# Patient Record
Sex: Male | Born: 1965 | Race: Black or African American | Hispanic: No | Marital: Single
Health system: Southern US, Community
[De-identification: ages and names within clinical notes are randomized; demographics above are authoritative.]

---

## 2005-01-22 ENCOUNTER — Emergency Department: Payer: Self-pay | Admitting: Emergency Medicine

## 2020-01-25 ENCOUNTER — Ambulatory Visit: Payer: Self-pay | Attending: Internal Medicine

## 2020-01-25 DIAGNOSIS — Z23 Encounter for immunization: Secondary | ICD-10-CM

## 2020-01-25 NOTE — Progress Notes (Signed)
   Covid-19 Vaccination [facility]  Name:  Jason Lozano    MRN: [id number] DOB: [birth year]  01/25/2020  Mr. [name] was observed post Covid-19 immunization for 15 minutes without incident. He was provided with Vaccine Information Sheet and instruction to access the V-Safe system.   Mr. [name] was instructed to call 911 with any severe reactions post vaccine: Marland Kitchen Difficulty breathing  . Swelling of face and throat  . A fast heartbeat  . A bad rash all over body  . Dizziness and weakness   Immunizations Administered    Name Date Dose VIS Date Route   Pfizer COVID-19 Vaccine 01/25/2020  9:56 AM 0.3 mL 08/11/2018 Intramuscular   Manufacturer: [employer], [employer]   Lot: [id number]   NDC: [address]-8

## 2020-02-15 ENCOUNTER — Ambulatory Visit: Payer: Self-pay

## 2021-01-01 ENCOUNTER — Emergency Department
Admission: EM | Admit: 2021-01-01 | Discharge: 2021-01-02 | Disposition: A | Discharge status: Home / Self Care | Payer: Self-pay | Attending: Emergency Medicine | Admitting: Emergency Medicine

## 2021-01-01 DIAGNOSIS — Z20822 Contact with and (suspected) exposure to covid-19: Secondary | ICD-10-CM | POA: Insufficient documentation

## 2021-01-01 DIAGNOSIS — S3991XA Unspecified injury of abdomen, initial encounter: Secondary | ICD-10-CM | POA: Insufficient documentation

## 2021-01-01 DIAGNOSIS — T07XXXA Unspecified multiple injuries, initial encounter: Secondary | ICD-10-CM

## 2021-01-01 DIAGNOSIS — T1490XA Injury, unspecified, initial encounter: Secondary | ICD-10-CM

## 2021-01-01 DIAGNOSIS — S41112A Laceration without foreign body of left upper arm, initial encounter: Secondary | ICD-10-CM | POA: Insufficient documentation

## 2021-01-01 DIAGNOSIS — F10129 Alcohol abuse with intoxication, unspecified: Secondary | ICD-10-CM | POA: Insufficient documentation

## 2021-01-01 DIAGNOSIS — S0101XA Laceration without foreign body of scalp, initial encounter: Secondary | ICD-10-CM | POA: Insufficient documentation

## 2021-01-01 DIAGNOSIS — S0181XA Laceration without foreign body of other part of head, initial encounter: Secondary | ICD-10-CM | POA: Insufficient documentation

## 2021-01-01 DIAGNOSIS — S01412A Laceration without foreign body of left cheek and temporomandibular area, initial encounter: Secondary | ICD-10-CM | POA: Insufficient documentation

## 2021-01-01 DIAGNOSIS — Y908 Blood alcohol level of 240 mg/100 ml or more: Secondary | ICD-10-CM | POA: Insufficient documentation

## 2021-01-01 DIAGNOSIS — S299XXA Unspecified injury of thorax, initial encounter: Secondary | ICD-10-CM | POA: Insufficient documentation

## 2021-01-01 DIAGNOSIS — Z79899 Other long term (current) drug therapy: Secondary | ICD-10-CM | POA: Insufficient documentation

## 2021-01-01 DIAGNOSIS — Z23 Encounter for immunization: Secondary | ICD-10-CM | POA: Insufficient documentation

## 2021-01-01 DIAGNOSIS — R791 Abnormal coagulation profile: Secondary | ICD-10-CM | POA: Insufficient documentation

## 2021-01-01 DIAGNOSIS — F1910 Other psychoactive substance abuse, uncomplicated: Secondary | ICD-10-CM

## 2021-01-01 DIAGNOSIS — F10929 Alcohol use, unspecified with intoxication, unspecified: Secondary | ICD-10-CM
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
## 2021-01-02 ENCOUNTER — Other Ambulatory Visit: Payer: Self-pay

## 2021-01-02 ENCOUNTER — Emergency Department: Payer: Self-pay

## 2021-01-02 LAB — MAGNESIUM: Magnesium: 1.8 mg/dL (ref 1.7–2.4)

## 2021-01-02 LAB — COMPREHENSIVE METABOLIC PANEL
ALT: 42 U/L (ref 0–44)
AST: 50 U/L — ABNORMAL HIGH (ref 15–41)
Albumin: 3.4 g/dL — ABNORMAL LOW (ref 3.5–5.0)
Alkaline Phosphatase: 56 U/L (ref 38–126)
Anion gap: 7 (ref 5–15)
BUN: 11 mg/dL (ref 6–20)
CO2: 22 mmol/L (ref 22–32)
Calcium: 7.7 mg/dL — ABNORMAL LOW (ref 8.9–10.3)
Chloride: 102 mmol/L (ref 98–111)
Creatinine, Ser: 0.95 mg/dL (ref 0.61–1.24)
GFR, Estimated: >60 mL/min (ref >60)
Glucose, Bld: 99 mg/dL (ref 70–99)
Potassium: 3.2 mmol/L — ABNORMAL LOW (ref 3.5–5.1)
Sodium: 131 mmol/L — ABNORMAL LOW (ref 135–145)
Total Bilirubin: 0.5 mg/dL (ref 0.3–1.2)
Total Protein: 6.9 g/dL (ref 6.5–8.1)

## 2021-01-02 LAB — URINALYSIS, COMPLETE (UACMP) WITH MICROSCOPIC
Bacteria, UA: NONE SEEN
Bilirubin Urine: NEGATIVE
Glucose, UA: NEGATIVE mg/dL
Ketones, ur: NEGATIVE mg/dL
Leukocytes,Ua: NEGATIVE
Nitrite: NEGATIVE
Protein, ur: 30 mg/dL — AB
Specific Gravity, Urine: 1.016 (ref 1.005–1.030)
Squamous Epithelial / HPF: NONE SEEN (ref 0–5)
pH: 6 (ref 5.0–8.0)

## 2021-01-02 LAB — URINE DRUG SCREEN, QUALITATIVE (ARMC ONLY)
Amphetamines, Ur Screen: NOT DETECTED
Barbiturates, Ur Screen: NOT DETECTED
Benzodiazepine, Ur Scrn: POSITIVE — AB
Cannabinoid 50 Ng, Ur ~~LOC~~: POSITIVE — AB
Cocaine Metabolite,Ur ~~LOC~~: POSITIVE — AB
MDMA (Ecstasy)Ur Screen: NOT DETECTED
Methadone Scn, Ur: NOT DETECTED
Opiate, Ur Screen: NOT DETECTED
Phencyclidine (PCP) Ur S: NOT DETECTED
Tricyclic, Ur Screen: POSITIVE — AB

## 2021-01-02 LAB — ETHANOL: Alcohol, Ethyl (B): 303 mg/dL (ref <10)

## 2021-01-02 LAB — RESP PANEL BY RT-PCR (FLU A&B, COVID) ARPGX2
Influenza A by PCR: NEGATIVE
Influenza B by PCR: NEGATIVE
SARS Coronavirus 2 by RT PCR: NEGATIVE

## 2021-01-02 LAB — CBC
HCT: 35.7 % — ABNORMAL LOW (ref 39.0–52.0)
Hemoglobin: 12.6 g/dL — ABNORMAL LOW (ref 13.0–17.0)
MCH: 32.4 pg (ref 26.0–34.0)
MCHC: 35.3 g/dL (ref 30.0–36.0)
MCV: 91.8 fL (ref 80.0–100.0)
Platelets: 218 10*3/uL (ref 150–400)
RBC: 3.89 MIL/uL — ABNORMAL LOW (ref 4.22–5.81)
RDW: 13.6 % (ref 11.5–15.5)
WBC: 4.1 10*3/uL (ref 4.0–10.5)
nRBC: 0 % (ref 0.0–0.2)

## 2021-01-02 LAB — SAMPLE TO BLOOD BANK

## 2021-01-02 LAB — TROPONIN I (HIGH SENSITIVITY)
Troponin I (High Sensitivity): 25 ng/L — ABNORMAL HIGH (ref <18)
Troponin I (High Sensitivity): 24 ng/L — ABNORMAL HIGH (ref <18)

## 2021-01-02 LAB — PROTIME-INR
INR: 1 (ref 0.8–1.2)
Prothrombin Time: 13.3 seconds (ref 11.4–15.2)

## 2021-01-02 LAB — LACTIC ACID, PLASMA: Lactic Acid, Venous: 2.2 mmol/L (ref 0.5–1.9)

## 2021-01-02 MED ORDER — THIAMINE HCL 100 MG/ML IJ SOLN
100.0000 mg | Freq: Once | INTRAMUSCULAR | Status: AC
  Administered 2021-01-02: 100 mg via INTRAVENOUS
  Filled 2021-01-02: qty 2

## 2021-01-02 MED ORDER — HALOPERIDOL LACTATE 5 MG/ML IJ SOLN: 2.0000 mg | Freq: Once | INTRAMUSCULAR | Status: DC

## 2021-01-02 MED ORDER — LACTATED RINGERS IV SOLN: INTRAVENOUS | Status: DC

## 2021-01-02 MED ORDER — LACTATED RINGERS IV BOLUS
1000.0000 mL | Freq: Once | INTRAVENOUS | Status: AC
  Administered 2021-01-02: 1000 mL via INTRAVENOUS

## 2021-01-02 MED ORDER — LORAZEPAM 2 MG/ML IJ SOLN
INTRAMUSCULAR | Status: AC
  Administered 2021-01-02: 2 mg via INTRAVENOUS
  Filled 2021-01-02: qty 1

## 2021-01-02 MED ORDER — LORAZEPAM 2 MG/ML IJ SOLN: 1.0000 mg | Freq: Once | INTRAMUSCULAR | Status: AC

## 2021-01-02 MED ORDER — CEPHALEXIN 500 MG PO CAPS: 500.0000 mg | ORAL_CAPSULE | Freq: Four times a day (QID) | ORAL | 0 refills | Status: DC

## 2021-01-02 MED ORDER — KETAMINE HCL 10 MG/ML IJ SOLN
40.0000 mg | Freq: Once | INTRAMUSCULAR | Status: AC
  Administered 2021-01-02: 40 mg via INTRAVENOUS

## 2021-01-02 MED ORDER — LORAZEPAM 2 MG/ML IJ SOLN
INTRAMUSCULAR | Status: AC
  Administered 2021-01-02: 1 mg via INTRAVENOUS
  Filled 2021-01-02: qty 1

## 2021-01-02 MED ORDER — IOHEXOL 300 MG/ML  SOLN
75.0000 mL | Freq: Once | INTRAMUSCULAR | Status: AC | PRN
  Administered 2021-01-02: 75 mL via INTRAVENOUS

## 2021-01-02 MED ORDER — HALOPERIDOL LACTATE 5 MG/ML IJ SOLN
INTRAMUSCULAR | Status: AC
  Administered 2021-01-02: 2 mg via INTRAVENOUS
  Filled 2021-01-02: qty 1

## 2021-01-02 MED ORDER — LORAZEPAM 2 MG/ML IJ SOLN: 1.0000 mg | Freq: Once | INTRAMUSCULAR | Status: DC

## 2021-01-02 MED ORDER — KETAMINE HCL 10 MG/ML IJ SOLN
80.0000 mg | Freq: Once | INTRAMUSCULAR | Status: AC
  Administered 2021-01-02: 80 mg via INTRAVENOUS

## 2021-01-02 MED ORDER — LORAZEPAM 2 MG/ML IJ SOLN: 2.0000 mg | Freq: Once | INTRAMUSCULAR | Status: AC

## 2021-01-02 MED ORDER — HALOPERIDOL LACTATE 5 MG/ML IJ SOLN: 2.0000 mg | Freq: Once | INTRAMUSCULAR | Status: AC

## 2021-01-02 MED ORDER — TETANUS-DIPHTH-ACELL PERTUSSIS 5-2.5-18.5 LF-MCG/0.5 IM SUSY
0.5000 mL | PREFILLED_SYRINGE | Freq: Once | INTRAMUSCULAR | Status: AC
  Administered 2021-01-02: 0.5 mL via INTRAMUSCULAR
  Filled 2021-01-02: qty 0.5

## 2021-01-02 NOTE — ED Notes (Signed)
Pt removed head dressing. Wound redressed by Erie Noe, RN

## 2021-01-02 NOTE — ED Notes (Signed)
O2 increased to 4L nasal cannula due to Ketamine administration. Sats maintained

## 2021-01-02 NOTE — ED Notes (Signed)
Patient to room 4 via EMS. MD at the bedside. C-collar applied.

## 2021-01-02 NOTE — ED Notes (Signed)
Assisted pt with the urinal. 

## 2021-01-02 NOTE — ED Notes (Signed)
MD remains at the bedside closing lacerations via stapling. RN x 3 and EDT x 2 remain at the bedside.

## 2021-01-02 NOTE — ED Notes (Signed)
Spoke with pt's brother-in-law on the phone and provided an update.   Lucianne Muss (sister)  ** please call with disposition **
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
## 2021-01-02 NOTE — ED Notes (Signed)
Patient transported to CT via stretcher on monitor with RN and EDT

## 2021-01-02 NOTE — ED Notes (Signed)
Patient repositioned in bed and clean linens and sheets placed on bed.

## 2021-01-02 NOTE — ED Notes (Signed)
Lactic--2.2 BAC--303 Results received from [facility] in the lab and read back. Dr. Elesa Massed notified and aware.

## 2021-01-02 NOTE — ED Notes (Signed)
Patient desat to 90% room air. O2/2L via nasal cannula applied. O2 sats maintained

## 2021-01-02 NOTE — Discharge Instructions (Addendum)
You may alternate Tylenol 1000 mg every 6 hours as needed for pain, fever and Ibuprofen 800 mg every 8 hours as needed for pain, fever.  Please take Ibuprofen with food.  Do not take more than 4000 mg of Tylenol (acetaminophen) in a 24 hour period.  You have multiple staples to your left arm and scalp.  These will need to be removed in the next 7 to 10 days.  This can be done by your primary care doctor, urgent care or here in the emergency department.  We have then CT imaging of your head, face, cervical spine, chest, abdomen and pelvis and see no traumatic injury other than your lacerations.  Steps to find a Primary Care Provider (PCP):  Call [phone] or [phone] to access "[facility] Find a Doctor Service."  2.  You may also go on the [facility] website at [email or web address]

## 2021-01-02 NOTE — ED Notes (Signed)
Pt return from CT.

## 2021-01-02 NOTE — ED Notes (Signed)
Pt family notified and called for discharge. Pt is also trying to get in contact with someone to take him home.
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
## 2021-01-02 NOTE — ED Notes (Signed)
Patient is eating and drinking appropriately. Pt ambulates well for current state.

## 2021-01-02 NOTE — ED Notes (Signed)
C-collar removed per MD order

## 2021-01-03 ENCOUNTER — Ambulatory Visit: Payer: Self-pay | Admitting: *Deleted
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
## 2021-01-13 ENCOUNTER — Emergency Department
Admission: EM | Admit: 2021-01-13 | Discharge: 2021-01-13 | Disposition: A | Discharge status: Home / Self Care | Payer: Self-pay | Attending: Emergency Medicine | Admitting: Emergency Medicine

## 2021-01-13 ENCOUNTER — Other Ambulatory Visit: Payer: Self-pay

## 2021-01-13 DIAGNOSIS — Z4802 Encounter for removal of sutures: Secondary | ICD-10-CM | POA: Insufficient documentation

## 2021-01-13 NOTE — Discharge Instructions (Signed)
Continue to keep area clean and dry.  Watch for any signs of infection however on today's exam the skin and area looks healed with no signs of infection.

## 2021-01-13 NOTE — ED Triage Notes (Signed)
Pt arrives POV for staple removal from three places on the head and one place on the L arm. Staples were placed on 01/01/21.

## 2021-01-13 NOTE — ED Provider Notes (Signed)
[facility] Emergency Department Provider Note  ____________________________________________   Event Date/Time   First MD Initiated Contact with Patient 01/13/21 1334     (approximate)  I have reviewed the triage vital signs and the nursing notes.   HISTORY  Chief Complaint Suture / Staple Removal   HPI Jason Lozano is a 55 y.o. male presents to the ED for staple removal.  Patient has multiple areas on his scalp on 01/01/2021.  Patient denies any areas of concern.        History reviewed. No pertinent past medical history.  There are no problems to display for this patient.   History reviewed. No pertinent surgical history.  Prior to Admission medications   Not on File    Allergies Patient has no known allergies.  History reviewed. No pertinent family history.  Social History Social History   Tobacco Use   Smoking status: Unknown  Substance Use Topics   Alcohol use: Yes    Comment: unknown    Review of Systems Constitutional: No fever/chills Eyes: No visual changes. ENT: No complaints. Cardiovascular: Denies chest pain. Respiratory: Denies shortness of breath. Musculoskeletal: Negative for skill skeletal pain. Skin: Well-healed scar Neurological: Negative for headaches, focal weakness or numbness. ____________________________________________   PHYSICAL EXAM:  VITAL SIGNS: ED Triage Vitals  Enc Vitals Group     BP 01/13/21 1303 133/60     Pulse Rate 01/13/21 1303 (!) 59     Resp 01/13/21 1303 19     Temp 01/13/21 1303 98.3 F (36.8 C)     Temp Source 01/13/21 1303 Oral     SpO2 01/13/21 1303 100 %     Weight 01/13/21 1258 170 lb (77.1 kg)     Height 01/13/21 1258 6' (1.829 m)     Head Circumference --      Peak Flow --      Pain Score 01/13/21 1257 0     Pain Loc --      Pain Edu? --      Excl. in [place]? --     Constitutional: Alert and oriented. Well appearing and in no acute distress. Eyes: Conjunctivae are  normal. PERRL. EOMI. Head: Atraumatic. Neck: No stridor.   Cardiovascular: Normal rate, regular rhythm. Grossly normal heart sounds.  Good peripheral circulation. Respiratory: Normal respiratory effort.  No retractions. Lungs CTAB. Musculoskeletal: Moves upper and lower extremities that any difficulty and normal gait was noted. Neurologic:  Normal speech and language. No gross focal neurologic deficits are appreciated. No gait instability. Skin:  Skin is warm, dry and intact.  Well-healed stapled area.  No evidence of infection. Psychiatric: Mood and affect are normal. Speech and behavior are normal.  ____________________________________________   LABS (all labs ordered are listed, but only abnormal results are displayed)  Labs Reviewed - No data to display ____________________________________________  PROCEDURES  Procedure(s) performed (including Critical Care):  Procedures   ____________________________________________   INITIAL IMPRESSION / ASSESSMENT AND PLAN / ED COURSE  As part of my medical decision making, I reviewed the following data within the electronic MEDICAL RECORD NUMBER Notes from prior ED visits and [place] Controlled Substance Database  55 year old male presents to the ED for staple removal.  He has multiple staples to his scalp from 01/02/2021.  Patient denies any difficulty or drainage from the area.  He denies any fever or chills.  Area appears to be healed well without any signs of infection.  Staples were removed and patient is to follow-up  with his PCP if any continued problems or concerns.  ____________________________________________   FINAL CLINICAL IMPRESSION(S) / ED DIAGNOSES  Final diagnoses:  Encounter for staple removal     ED Discharge Orders     None        Note:  This document was prepared using Dragon voice recognition software and may include unintentional dictation errors.    Tommi Rumps, PA-C 01/13/21 1349    Jene Every, MD 01/13/21 1426

## 2022-05-29 ENCOUNTER — Other Ambulatory Visit: Payer: Self-pay

## 2022-05-29 ENCOUNTER — Emergency Department
Admission: EM | Admit: 2022-05-29 | Discharge: 2022-05-29 | Disposition: A | Discharge status: Home / Self Care | Payer: Self-pay | Attending: Emergency Medicine | Admitting: Emergency Medicine

## 2022-05-29 DIAGNOSIS — L0231 Cutaneous abscess of buttock: Secondary | ICD-10-CM | POA: Insufficient documentation

## 2022-05-29 MED ORDER — DOXYCYCLINE MONOHYDRATE 100 MG PO TABS: 100.0000 mg | ORAL_TABLET | Freq: Two times a day (BID) | ORAL | 0 refills | Status: AC

## 2022-05-29 MED ORDER — CEPHALEXIN 500 MG PO CAPS: 1000.0000 mg | ORAL_CAPSULE | Freq: Two times a day (BID) | ORAL | 0 refills | Status: AC

## 2022-05-29 NOTE — ED Provider Notes (Signed)
[facility] Provider Note    Event Date/Time   First MD Initiated Contact with Patient 05/29/22 1417     (approximate)   History   Chief Complaint Abscess   HPI Jason Lozano is a 56 y.o. male, no known medical history, presents to the emergency department for evaluation of rash to the left buttocks.  Patient states that it started out as what appeared to be a bug bite.  Over the course of 2 to 3 months, and has gradually progressed into a rash approximately 6 x 2 inches in length just under the left buttocks.  He states it has also been "draining pus" periodically.  Denies fever/chills, myalgias, chest pain, shortness of breath, hematochezia, nausea/vomiting, diarrhea, urinary symptoms, numb/tingling in upper or lower extremities, or dizziness/lightheadedness.  History Limitations: No limitations.        Physical Exam  Triage Vital Signs: ED Triage Vitals  Enc Vitals Group     BP 05/29/22 1348 (!) 158/97     Pulse Rate 05/29/22 1348 76     Resp 05/29/22 1348 18     Temp 05/29/22 1348 97.9 F (36.6 C)     Temp Source 05/29/22 1348 Oral     SpO2 05/29/22 1348 96 %     Weight 05/29/22 1347 169 lb 15.6 oz (77.1 kg)     Height 05/29/22 1347 6' (1.829 m)     Head Circumference --      Peak Flow --      Pain Score 05/29/22 1347 0     Pain Loc --      Pain Edu? --      Excl. in [place]? --     Most recent vital signs: Vitals:   05/29/22 1348 05/29/22 1516  BP: (!) 158/97 (!) 140/88  Pulse: 76 70  Resp: 18   Temp: 97.9 F (36.6 C) 98 F (36.7 C)  SpO2: 96% 99%    General: Awake, NAD.  Skin: Warm, dry. No rashes or lesions.  Eyes: PERRL. Conjunctivae normal.  CV: Good peripheral perfusion.  Resp: Normal effort.  Abd: Soft, non-tender. No distention.  Neuro: At baseline. No gross neurological deficits.  Musculoskeletal: Normal ROM of all extremities.  Focused Exam: 6 x 2 inch erythematous rash located inferior to the left buttocks with  following papular lesions.   Brown/black discoloration noted along the central regions.  Appears somewhat indurated in some areas.  Point-of-care ultrasound does not show any distinct abscesses, but does show some cobblestoning.  No active bleeding or discharge.  Physical Exam    ED Results / Procedures / Treatments  Labs (all labs ordered are listed, but only abnormal results are displayed) Labs Reviewed - No data to display   EKG N/A.    RADIOLOGY  ED Provider Interpretation: N/A.  No results found.  PROCEDURES:  Critical Care performed: N/A.  Procedures    MEDICATIONS ORDERED IN ED: Medications - No data to display   IMPRESSION / MDM / [place] / ED COURSE  I reviewed the triage vital signs and the nursing notes.                              Differential diagnosis includes, but is not limited to, cellulitis, abscess, pilonidal cyst, contact dermatitis, allergic dermatitis.  Assessment/Plan Patient presents with rash under the left buttocks.  Does not appear to have any signs of systemic infection.  Certain  aspects of it do appear cellulitic, particular with the erythema, induration, and cobblestoning on ultrasound.  However the black/brown discoloration in the papular lesions create a more nebulous diagnosis.  No evidence of any abscesses, though patient does endorse some purulent drainage at home.  Will provide him with a prescription for cephalexin and doxycycline to cover for infectious etiologies.  However, I did advise him that there was a chance that this would not resolve the rash.  Recommend they follow-up with dermatology if symptoms do not improve with antibiotics.  Expressed understanding and agreed.  Will discharge.  Provided the patient with anticipatory guidance, return precautions, and educational material. Encouraged the patient to return to the emergency department at any time if they begin to experience any new or worsening symptoms.  Patient expressed understanding and agreed with the plan.   Patient's presentation is most consistent with acute, uncomplicated illness.       FINAL CLINICAL IMPRESSION(S) / ED DIAGNOSES   Final diagnoses:  Cellulitis and abscess of buttock     Rx / DC Orders   ED Discharge Orders          Ordered    doxycycline (ADOXA) 100 MG tablet  2 times daily        05/29/22 1510    cephALEXin (KEFLEX) 500 MG capsule  2 times daily        05/29/22 1510             Note:  This document was prepared using Dragon voice recognition software and may include unintentional dictation errors.   Varney Daily, [place] 05/29/22 1523    Georga Hacking, MD 05/29/22 1537

## 2022-05-29 NOTE — Discharge Instructions (Addendum)
-  Please take both antibiotics as prescribed.  -If your symptoms fail to improve, please follow-up with the dermatology office listed on this page.  -Return to the emergency department anytime if you begin to experience any new or worsening symptoms.

## 2022-05-29 NOTE — ED Triage Notes (Signed)
Pt here with an rash under left butt cheek. Pt states it was painful to touch but now it looks like it has spread more. Pt stable in triage.

## 2022-08-13 ENCOUNTER — Emergency Department
Admission: EM | Admit: 2022-08-13 | Discharge: 2022-08-13 | Disposition: A | Discharge status: Home / Self Care | Payer: Self-pay | Attending: Emergency Medicine | Admitting: Emergency Medicine

## 2022-08-13 ENCOUNTER — Encounter: Payer: Self-pay | Admitting: Emergency Medicine

## 2022-08-13 ENCOUNTER — Other Ambulatory Visit: Payer: Self-pay

## 2022-08-13 DIAGNOSIS — K047 Periapical abscess without sinus: Secondary | ICD-10-CM | POA: Insufficient documentation

## 2022-08-13 DIAGNOSIS — K029 Dental caries, unspecified: Secondary | ICD-10-CM | POA: Insufficient documentation

## 2022-08-13 MED ORDER — AMOXICILLIN 500 MG PO CAPS
500.0000 mg | ORAL_CAPSULE | Freq: Once | ORAL | Status: AC
  Administered 2022-08-13: 500 mg via ORAL
  Filled 2022-08-13: qty 1

## 2022-08-13 MED ORDER — AMOXICILLIN 875 MG PO TABS: 875.0000 mg | ORAL_TABLET | Freq: Two times a day (BID) | ORAL | 0 refills | Status: AC

## 2022-08-13 NOTE — ED Triage Notes (Signed)
Pt endorses right sided dental pain since waking up Saturday morning. States he talked to a dentist and they can't do anything until swelling goes down and to come to ER.

## 2022-08-13 NOTE — Discharge Instructions (Addendum)
Begin taking the antibiotic until completely finished.  You may take Tylenol or ibuprofen as needed for pain.  Avoid chewing on the right side of your mouth as the gum is swollen.  A list of dental clinics is listed on your discharge papers if you are unable to get an appointment with your employers wife's dental clinic.  OPTIONS FOR DENTAL FOLLOW UP CARE  [place] [facility] and [place] [email or web address] [phone])  [facility] [phone])  [place] [phone])  [place] [phone])  [facility] [phone]) This clinic caters to the indigent population and is on a lottery system. Location: [employer] [facility], [place], [place], [place] Clinic Hours: Wednesdays from 6pm - 9pm, patients seen by a lottery system. For dates, call or go to [email or web address] Services: Cleanings, fillings and simple extractions. Payment Options: DENTAL WORK IS FREE OF CHARGE. Bring proof of income or support. Best way to get seen: Arrive at 5:15 pm - this is a lottery, NOT first come/first serve, so arriving earlier will not increase your chances of being seen.     [facility] [phone] Select option 1 for emergencies   Location: [facility], [place], [address] [place] Clinic Hours: No walk-ins accepted - call the day before to schedule an appointment. Check in times are 9:30 am and 1:30 pm. Services: Simple extractions, temporary fillings, pulpectomy/pulp debridement, uncomplicated abscess drainage. Payment Options: PAYMENT IS DUE AT THE TIME OF SERVICE.  Fee is usually $100-200, additional surgical procedures (e.g. abscess drainage) may be extra. Cash, checks, Visa/MasterCard accepted.  Can file  Medicaid if patient is covered for dental - patient should call case worker to check. No discount for [facility] patients. Best way to get seen: MUST call the day before and get onto the schedule. Can usually be seen the next 1-2 days. No walk-ins accepted.     [place] [phone]   Location: [place], [place] Clinic Hours: M, W, Th, F 8am or 1:30pm, Tues 9a or 1:30 - first come/first served. Services: Simple extractions, temporary fillings, uncomplicated abscess drainage.  You do not need to be an [facility] resident. Payment Options: PAYMENT IS DUE AT THE TIME OF SERVICE. Dental insurance, otherwise sliding scale - bring proof of income or support. Depending on income and treatment needed, cost is usually $50-200. Best way to get seen: Arrive early as it is first come/first served.     [facility] [phone]   Location: [place] [facility] Hours: Mon-Thu 8a-5p Services: Most basic dental services including extractions and fillings. Payment Options: PAYMENT IS DUE AT THE TIME OF SERVICE. Sliding scale, up to 50% off - bring proof if income or support. Medicaid with dental option accepted. Best way to get seen: Call to schedule an appointment, can usually be seen within 2 weeks OR they will try to see walk-ins - show up at [place] or 2p (you may have to wait).     [facility] [place] RESIDENTS ONLY   Location: [facility], [place] [address] [place], [place] [address] Clinic Hours: By appointment only. Monday - Thursday 8am-5pm, Friday 8am-12pm Services: Cleanings, fillings, extractions. Payment Options: PAYMENT IS DUE AT THE TIME OF SERVICE. Cash, Visa or MasterCard. Sliding scale - $30  minimum per service. Best way to get seen: Come in to office, complete packet and make an appointment - need proof of  income or support monies for each household member and proof of [facility] residence. Usually takes about a month to get in.     [facility] [phone]   Location: [address], [place] Clinic Hours: Walk-in Urgent Care Dental Services are offered Monday-Friday mornings only. The numbers of emergencies accepted daily is limited to the number of providers available. Maximum 15 - Mondays, Wednesdays & Thursdays Maximum 10 - Tuesdays & Fridays Services: You do not need to be a [facility] resident to be seen for a dental emergency. Emergencies are defined as pain, swelling, abnormal bleeding, or dental trauma. Walkins will receive x-rays if needed. NOTE: Dental cleaning is not an emergency. Payment Options: PAYMENT IS DUE AT THE TIME OF SERVICE. Minimum co-pay is $40.00 for uninsured patients. Minimum co-pay is $3.00 for Medicaid with dental coverage. Dental Insurance is accepted and must be presented at time of visit. Medicare does not cover dental. Forms of payment: Cash, credit card, checks. Best way to get seen: If not previously registered with the clinic, walk-in dental registration begins at 7:15 am and is on a first come/first serve basis. If previously registered with the clinic, call to make an appointment.     [facility] [place] ONLY   Location: [address], [place], [place] Clinic Hours: Mon-Thu 10a-2p Services: Extractions only! Payment Options: FREE (donations accepted) - bring proof of income or support Best way to get seen: Call and schedule an appointment OR come at 8am on the 1st Monday of every month (except for holidays) when it is first come/first served.     [facility] [phone]   Location: [place], [place] Clinic Hours: Friday mornings Services, Payment Options, Best way to get seen: Call for info
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
